# Patient Record
Sex: Female | Born: 1960 | Race: White | Hispanic: No | Marital: Married | Smoking: Never smoker
Health system: Southern US, Community
[De-identification: ages and names within clinical notes are randomized; demographics above are authoritative.]

---

## 2017-04-27 ENCOUNTER — Encounter: Payer: Self-pay | Admitting: Emergency Medicine

## 2017-04-27 ENCOUNTER — Emergency Department
Admission: EM | Admit: 2017-04-27 | Discharge: 2017-04-27 | Disposition: A | Discharge status: Home / Self Care | Payer: BLUE CROSS/BLUE SHIELD | Source: Home / Self Care | Attending: Family Medicine | Admitting: Family Medicine

## 2017-04-27 ENCOUNTER — Emergency Department (INDEPENDENT_AMBULATORY_CARE_PROVIDER_SITE_OTHER): Payer: BLUE CROSS/BLUE SHIELD

## 2017-04-27 ENCOUNTER — Other Ambulatory Visit: Payer: Self-pay

## 2017-04-27 DIAGNOSIS — B9789 Other viral agents as the cause of diseases classified elsewhere: Secondary | ICD-10-CM

## 2017-04-27 DIAGNOSIS — M67921 Unspecified disorder of synovium and tendon, right upper arm: Secondary | ICD-10-CM

## 2017-04-27 DIAGNOSIS — M542 Cervicalgia: Secondary | ICD-10-CM

## 2017-04-27 DIAGNOSIS — M79601 Pain in right arm: Secondary | ICD-10-CM

## 2017-04-27 DIAGNOSIS — M7551 Bursitis of right shoulder: Secondary | ICD-10-CM

## 2017-04-27 DIAGNOSIS — G5621 Lesion of ulnar nerve, right upper limb: Secondary | ICD-10-CM

## 2017-04-27 DIAGNOSIS — M7711 Lateral epicondylitis, right elbow: Secondary | ICD-10-CM | POA: Diagnosis not present

## 2017-04-27 DIAGNOSIS — J069 Acute upper respiratory infection, unspecified: Secondary | ICD-10-CM

## 2017-04-27 MED ORDER — KETOROLAC TROMETHAMINE 30 MG/ML IJ SOLN
30.0000 mg | Freq: Once | INTRAMUSCULAR | Status: AC
  Administered 2017-04-27: 30 mg via INTRAMUSCULAR

## 2017-04-27 MED ORDER — AMOXICILLIN 875 MG PO TABS: 875.0000 mg | ORAL_TABLET | Freq: Two times a day (BID) | ORAL | 0 refills | Status: DC

## 2017-04-27 MED ORDER — PREDNISONE 20 MG PO TABS: ORAL_TABLET | ORAL | 0 refills | Status: DC

## 2017-04-27 NOTE — Discharge Instructions (Signed)
Take plain guaifenesin (1200mg  extended release tabs such as Mucinex) twice daily, with plenty of water, for cough and congestion.  May add Pseudoephedrine (30mg , one or two every 4 to 6 hours) for sinus congestion.  Get adequate rest.   May use Afrin nasal spray (or generic oxymetazoline) each morning for about 5 days and then discontinue.  Also recommend using saline nasal spray several times daily and saline nasal irrigation (AYR is a common brand).  Use Flonase nasal spray each morning after using Afrin nasal spray and saline nasal irrigation. Try warm salt water gargles for sore throat.  Stop all antihistamines for now, and other non-prescription cough/cold preparations. May take Delsym Cough Suppressant at bedtime for nighttime cough.   Begin range of motion and stretching exercises as per instruction sheets.

## 2017-04-27 NOTE — ED Provider Notes (Signed)
[facility]    CSN: [id number] Arrival date & time: 04/27/17  [id number]     History   Chief Complaint Chief Complaint  Patient presents with  . Elbow Pain  . Sinus Problem    HPI Mikayla Hull is a 57 y.o. female.   Patient presents with several problems: 1)  She has had a 4 day history of typical cold-like symptoms developing over several days, including mild sore throat, sinus congestion (worse on the right), headache, fatigue, and cough.   No fevers, chills, and sweats.  She has a history of sinusitis. 2)  About 7 or 8 months ago she developed pain in her right elbow that occasionally radiates to her right shoulder.  She has a past history of right carpal tunnel surgery.  During the past 6 months she has had intermittent tingling in her right 4th and 5th fingers. 3)  Recently she has developed increasing pain in her right posterior neck radiating into her right shoulder.  The pain is worse when she flexes her neck, and rotates her head to the left.    The history is provided by the patient.    History reviewed. No pertinent past medical history.  There are no active problems to display for this patient.   History reviewed. No pertinent surgical history.  OB History    No data available       Home Medications    Prior to Admission medications   Medication Sig Start Date End Date Taking? Authorizing Provider  venlafaxine (EFFEXOR) 75 MG tablet Take 75 mg by mouth 2 (two) times daily.   Yes [provider]  amoxicillin (AMOXIL) 875 MG tablet Take 1 tablet (875 mg total) by mouth 2 (two) times daily. 04/27/17   Lattie HawBeese, Welton Bord A, MD  predniSONE (DELTASONE) 20 MG tablet Take one tab by mouth twice daily for 5 days, then one daily for 4 days. Take with food. 04/27/17   Lattie HawBeese, Donnamaria Shands A, MD    Family History No family history on file.  Social History Social History   Tobacco Use  . Smoking status: Never Smoker  . Smokeless tobacco: Never Used    Substance Use Topics  . Alcohol use: No    Frequency: Never  . Drug use: No     Allergies   Patient has no known allergies.   Review of Systems Review of Systems  Constitutional: Positive for activity change and fatigue. Negative for chills, diaphoresis and fever.  HENT: Positive for congestion, rhinorrhea, sinus pressure, sinus pain and sore throat. Negative for ear pain, facial swelling, hearing loss, sneezing and trouble swallowing.   Respiratory: Positive for cough. Negative for shortness of breath, wheezing and stridor.   Cardiovascular: Negative.   Gastrointestinal: Negative.   Genitourinary: Negative.   Musculoskeletal: Positive for neck pain. Negative for joint swelling.  Skin: Negative.   Neurological: Positive for numbness.      Physical Exam Triage Vital Signs ED Triage Vitals  Enc Vitals Group     BP 04/27/17 0923 136/72     Pulse Rate 04/27/17 0923 75     Resp --      Temp 04/27/17 0923 98.2 F (36.8 C)     Temp Source 04/27/17 0923 Oral     SpO2 04/27/17 0923 97 %     Weight 04/27/17 0924 201 lb (91.2 kg)     Height 04/27/17 0924 5\' 8"  (1.727 m)     Head Circumference --  Peak Flow --      Pain Score 04/27/17 0924 8     Pain Loc --      Pain Edu? --      Excl. in GC? --    No data found.  Updated Vital Signs BP 136/72 (BP Location: Right Arm)   Pulse 75   Temp 98.2 F (36.8 C) (Oral)   Ht 5\' 8"  (1.727 m)   Wt 201 lb (91.2 kg)   SpO2 97%   BMI 30.56 kg/m   Visual Acuity Right Eye Distance:   Left Eye Distance:   Bilateral Distance:    Right Eye Near:   Left Eye Near:    Bilateral Near:     Physical Exam  Constitutional: She appears well-developed and well-nourished. No distress.  HENT:  Head: Normocephalic.  Right Ear: Tympanic membrane, external ear and ear canal normal.  Left Ear: Tympanic membrane, external ear and ear canal normal.  Nose: Nose normal.  Mouth/Throat: Oropharynx is clear and moist.  Eyes: Conjunctivae are  normal. Pupils are equal, round, and reactive to light. Right eye exhibits no discharge. Left eye exhibits no discharge.  Neck:    Lateral nodes tender to palpation, worse on the left.  There is mild tenderness of the right occipital area and right trapezius muscle extending to the right shoulder.  Cardiovascular: Normal heart sounds.  Pulmonary/Chest: Breath sounds normal.  Abdominal: There is no tenderness.  Musculoskeletal:       Right shoulder: She exhibits tenderness and pain. She exhibits normal range of motion and no crepitus.       Right elbow: She exhibits normal range of motion. Tenderness found. Medial epicondyle tenderness noted.       Arms: RIght shoulder has relatively good range of motion.  Negative Apley's test.  There is distinct tenderness to palpation over the right subacromial bursa, and distinct tenderness to palpation over the long head of the biceps tendon.  Right elbow has distinct tenderness to palpation over the medial epicondyle.  Palpation causes distinct paresthesias in her right 4th and 5th fingers.  Neurological: She is alert.  Skin: Skin is warm and dry.  Nursing note and vitals reviewed.     UC Treatments / Results  Labs (all labs ordered are listed, but only abnormal results are displayed) Labs Reviewed - No data to display  EKG  EKG Interpretation None       Radiology Dg Cervical Spine Complete  Result Date: 04/27/2017 CLINICAL DATA:  Right-sided neck pain and right arm pain. EXAM: CERVICAL SPINE - COMPLETE 4+ VIEW COMPARISON:  None. FINDINGS: There is no evidence of cervical spine fracture or prevertebral soft tissue swelling. Alignment is normal. No other significant bone abnormalities are identified. IMPRESSION: Negative cervical spine radiographs. Electronically Signed   By: Francene Boyers M.D.   On: 04/27/2017 10:08    Procedures Procedures (including critical care time)  Medications Ordered in UC Medications  ketorolac (TORADOL) 30  MG/ML injection 30 mg (30 mg Intramuscular Given 04/27/17 1052)     Initial Impression / Assessment and Plan / UC Course  I have reviewed the triage vital signs and the nursing notes.  Pertinent labs & imaging results that were available during my care of the patient were reviewed by me and considered in my medical decision making (see chart for details).    Administered Toradol 30mg  IM. Begin prednisone burst/taper. Take plain guaifenesin (1200mg  extended release tabs such as Mucinex) twice daily, with plenty of water, for  cough and congestion.  May add Pseudoephedrine (30mg , one or two every 4 to 6 hours) for sinus congestion.  Get adequate rest.   May use Afrin nasal spray (or generic oxymetazoline) each morning for about 5 days and then discontinue.  Also recommend using saline nasal spray several times daily and saline nasal irrigation (AYR is a common brand).  Use Flonase nasal spray each morning after using Afrin nasal spray and saline nasal irrigation. Try warm salt water gargles for sore throat.  Stop all antihistamines for now, and other non-prescription cough/cold preparations. May take Delsym Cough Suppressant at bedtime for nighttime cough.   Begin range of motion and stretching exercises as per instruction sheets. Followup with Dr. [facility] ([facility] for further evaluation of right shoulder and elbow pain.    Final Clinical Impressions(s) / UC Diagnoses   Final diagnoses:  Right lateral epicondylitis  Acute bursitis of right shoulder  Tendinopathy of right biceps tendon  Ulnar neuropathy at elbow of right upper extremity  Viral URI with cough    ED Discharge Orders        Ordered    predniSONE (DELTASONE) 20 MG tablet     04/27/17 1050    amoxicillin (AMOXIL) 875 MG tablet  2 times daily     04/27/17 1050          Lattie Haw, MD 05/02/17 2101

## 2017-04-27 NOTE — ED Triage Notes (Signed)
Rt elbow pain radiates up into neck x 8 months Cough and congestion x 4 days

## 2017-06-16 ENCOUNTER — Other Ambulatory Visit: Payer: Self-pay

## 2017-06-16 ENCOUNTER — Encounter: Payer: Self-pay | Admitting: Emergency Medicine

## 2017-06-16 ENCOUNTER — Emergency Department
Admission: EM | Admit: 2017-06-16 | Discharge: 2017-06-16 | Disposition: A | Discharge status: Home / Self Care | Payer: BLUE CROSS/BLUE SHIELD | Source: Home / Self Care | Attending: Emergency Medicine | Admitting: Emergency Medicine

## 2017-06-16 DIAGNOSIS — R05 Cough: Secondary | ICD-10-CM | POA: Diagnosis not present

## 2017-06-16 DIAGNOSIS — R059 Cough, unspecified: Secondary | ICD-10-CM

## 2017-06-16 DIAGNOSIS — J40 Bronchitis, not specified as acute or chronic: Secondary | ICD-10-CM

## 2017-06-16 MED ORDER — PREDNISONE 10 MG PO TABS: ORAL_TABLET | ORAL | 0 refills | Status: DC

## 2017-06-16 MED ORDER — ACETAMINOPHEN 325 MG PO TABS
975.0000 mg | ORAL_TABLET | Freq: Once | ORAL | Status: AC
  Administered 2017-06-16: 975 mg via ORAL

## 2017-06-16 MED ORDER — PROMETHAZINE-CODEINE 6.25-10 MG/5ML PO SYRP: ORAL_SOLUTION | ORAL | 0 refills | Status: AC

## 2017-06-16 MED ORDER — AZITHROMYCIN 250 MG PO TABS: ORAL_TABLET | ORAL | 0 refills | Status: DC

## 2017-06-16 MED ORDER — METHYLPREDNISOLONE ACETATE 80 MG/ML IJ SUSP
80.0000 mg | Freq: Once | INTRAMUSCULAR | Status: AC
  Administered 2017-06-16: 80 mg via INTRAMUSCULAR

## 2017-06-16 NOTE — ED Triage Notes (Signed)
Reports cough, sore throat, headache and chest soreness from cough for past 4 days. Called PCP yesterday and got rx for tessalon; discovered no longer in network for new insurance so has come here. No OTC today.

## 2017-06-16 NOTE — Discharge Instructions (Signed)
Today, we gave you a cortisone shot of Depo-Medrol. Take prescriptions as prescribed. Follow-up with PCP within one week.

## 2017-06-17 NOTE — ED Provider Notes (Signed)
[facility]    CSN: [id number] Arrival date & time: 06/16/17  0930     History   Chief Complaint Chief Complaint  Patient presents with  . Cough  . Headache  . Sore Throat    HPI Mikayla Hull is a 57 y.o. female.   HPI  cough, sore throat, headache and chest soreness from cough for past 6 days.  Called PCP yesterday and got rx for tessalon; discovered no longer in network for new insurance so has come here. No OTC today.   History reviewed. No pertinent past medical history.  There are no active problems to display for this patient.   History reviewed. No pertinent surgical history.  OB History    No data available       Home Medications    Prior to Admission medications   Medication Sig Start Date End Date Taking? Authorizing Provider  azithromycin (ZITHROMAX Z-PAK) 250 MG tablet Take 2 tablets on day one, then 1 tablet daily on days 2 through 5 06/16/17   Lajean Manes, MD  predniSONE (DELTASONE) 10 MG tablet Take as directed for 6 days. Start taking on Thursday 2/28. Take 6 on day 1, 5 on day 2, 4 on day 3, then 3 tablets on day 4, then 2 tablets on day 5, then 1 on day 6. 06/16/17   Lajean Manes, MD  promethazine-codeine [facility] WITH CODEINE) 6.25-10 MG/5ML syrup Take 1-2 teaspoons every 4-6 hours as needed for cough. May cause drowsiness. 06/16/17   Lajean Manes, MD    Family History History reviewed. No pertinent family history.  Social History Social History   Tobacco Use  . Smoking status: Never Smoker  . Smokeless tobacco: Never Used  Substance Use Topics  . Alcohol use: No    Frequency: Never  . Drug use: No     Allergies   Patient has no known allergies.   Review of Systems Review of Systems  Constitutional: Positive for fatigue and fever.  HENT: Positive for congestion and sore throat.   Respiratory: Positive for cough.   Cardiovascular: Negative for palpitations and leg swelling.  Gastrointestinal: Negative.     All other systems reviewed and are negative.    Physical Exam Triage Vital Signs ED Triage Vitals  Enc Vitals Group     BP 06/16/17 1003 127/70     Pulse Rate 06/16/17 1003 97     Resp 06/16/17 1003 18     Temp 06/16/17 1003 98.4 F (36.9 C)     Temp Source 06/16/17 1003 Oral     SpO2 06/16/17 1003 97 %     Weight 06/16/17 1004 197 lb (89.4 kg)     Height 06/16/17 1004 5\' 8"  (1.727 m)     Head Circumference --      Peak Flow --      Pain Score 06/16/17 1003 2     Pain Loc --      Pain Edu? --      Excl. in GC? --    No data found.  Updated Vital Signs BP 127/70 (BP Location: Right Arm)   Pulse 97   Temp 98.4 F (36.9 C) (Oral)   Resp 18   Ht 5\' 8"  (1.727 m)   Wt 197 lb (89.4 kg)   SpO2 97%   BMI 29.95 kg/m   Visual Acuity Right Eye Distance:   Left Eye Distance:   Bilateral Distance:    Right Eye Near:   Left Eye  Near:    Bilateral Near:     Physical Exam  Constitutional: She is oriented to person, place, and time. She appears well-developed and well-nourished. No distress.  HENT:  Head: Normocephalic and atraumatic.  Right Ear: Tympanic membrane normal.  Left Ear: Tympanic membrane normal.  Nose: Nose normal.  Mouth/Throat: Oropharynx is clear and moist. No oropharyngeal exudate.  Eyes: Right eye exhibits no discharge. Left eye exhibits no discharge. No scleral icterus.  Neck: Neck supple.  Cardiovascular: Normal rate, regular rhythm and normal heart sounds.  Pulmonary/Chest: No accessory muscle usage or stridor. No respiratory distress. She has rhonchi. She has no rales.  Hacking cough noted.  Breath sounds equal bilaterally.  Diffuse rhonchi present.  Rare late expiratory wheezes heard anteriorly on forced expiration only.  No rales.  Lymphadenopathy:    She has no cervical adenopathy.  Neurological: She is alert and oriented to person, place, and time.  Skin: Skin is warm and dry.  Nursing note and vitals reviewed.    UC Treatments / Results   Labs (all labs ordered are listed, but only abnormal results are displayed) Labs Reviewed - No data to display  EKG  EKG Interpretation None       Radiology No results found.  Procedures Procedures (including critical care time)  Medications Ordered in UC Medications  acetaminophen (TYLENOL) tablet 975 mg (975 mg Oral Given 06/16/17 1009)  methylPREDNISolone acetate (DEPO-MEDROL) injection 80 mg (80 mg Intramuscular Given 06/16/17 1046)     Initial Impression / Assessment and Plan / UC Course  I have reviewed the triage vital signs and the nursing notes.  Pertinent labs & imaging results that were available during my care of the patient were reviewed by me and considered in my medical decision making (see chart for details).     Final Clinical Impressions(s) / UC Diagnoses   Final diagnoses:  Cough  Bronchitis   Treatment options discussed, as well as risks, benefits, alternatives. Patient voiced understanding and agreement with the following plans: Depo-Medrol 80 mg IM stat ED Discharge Orders        Ordered    azithromycin (ZITHROMAX Z-PAK) 250 MG tablet     06/16/17 1045    predniSONE (DELTASONE) 10 MG tablet     06/16/17 1045    promethazine-codeine (PHENERGAN WITH CODEINE) 6.25-10 MG/5ML syrup     06/16/17 1045     Follow-up with your primary care doctor in 5-7 days if not improving, or sooner if symptoms become worse. Precautions discussed. Red flags discussed. Questions invited and answered. Patient voiced understanding and agreement.     Lajean ManesMassey, David, MD 06/17/17 [phone]

## 2017-12-01 ENCOUNTER — Emergency Department (INDEPENDENT_AMBULATORY_CARE_PROVIDER_SITE_OTHER)
Admission: EM | Admit: 2017-12-01 | Discharge: 2017-12-01 | Disposition: A | Discharge status: Home / Self Care | Payer: BLUE CROSS/BLUE SHIELD | Source: Home / Self Care | Attending: Family Medicine | Admitting: Family Medicine

## 2017-12-01 ENCOUNTER — Other Ambulatory Visit: Payer: Self-pay

## 2017-12-01 ENCOUNTER — Encounter: Payer: Self-pay | Admitting: Emergency Medicine

## 2017-12-01 DIAGNOSIS — J320 Chronic maxillary sinusitis: Secondary | ICD-10-CM

## 2017-12-01 DIAGNOSIS — H60501 Unspecified acute noninfective otitis externa, right ear: Secondary | ICD-10-CM | POA: Diagnosis not present

## 2017-12-01 MED ORDER — AMOXICILLIN 500 MG PO CAPS: 500.0000 mg | ORAL_CAPSULE | Freq: Three times a day (TID) | ORAL | 0 refills | Status: DC

## 2017-12-01 MED ORDER — NEOMYCIN-POLYMYXIN-HC 3.5-10000-1 OT SUSP: 3.0000 [drp] | Freq: Three times a day (TID) | OTIC | 0 refills | Status: AC

## 2017-12-01 NOTE — ED Provider Notes (Signed)
[facility]    CSN: [id number] Arrival date & time: 12/01/17  1742     History   Chief Complaint Chief Complaint  Patient presents with  . Otalgia  . Neck Pain    HPI Mikayla Hull is a 57 y.o. female.   HPI Mikayla Hull is a 57 y.o. female presenting to UC with c/o 3 days of worsening Right ear pain that is aching and sore, radiating down Right side of her neck. Ear is painful to touch she could not lay on her Right side last night. Ear is also mildly itchy. No recent swimming and she cannot recall any water getting into her ear. She has tried OTC pain relief ear drops with minimal relief. Minimal Right sided nasal congestion and pressure started 2 days ago. Denies cough, congestion, n/v/d.    History reviewed. No pertinent past medical history.  There are no active problems to display for this patient.   History reviewed. No pertinent surgical history.  OB History   None      Home Medications    Prior to Admission medications   Medication Sig Start Date End Date Taking? Authorizing Provider  amoxicillin (AMOXIL) 500 MG capsule Take 1 capsule (500 mg total) by mouth 3 (three) times daily. 12/01/17   Lurene ShadowPhelps, Rida Loudin O, PA-C  azithromycin (ZITHROMAX Z-PAK) 250 MG tablet Take 2 tablets on day one, then 1 tablet daily on days 2 through 5 06/16/17   Lajean ManesMassey, David, MD  neomycin-polymyxin-hydrocortisone (CORTISPORIN) 3.5-10000-1 OTIC suspension Place 3 drops into the right ear 3 (three) times daily for 7 days. 12/01/17 12/08/17  Lurene ShadowPhelps, Aldrich Lloyd O, PA-C  predniSONE (DELTASONE) 10 MG tablet Take as directed for 6 days. Start taking on Thursday 2/28. Take 6 on day 1, 5 on day 2, 4 on day 3, then 3 tablets on day 4, then 2 tablets on day 5, then 1 on day 6. 06/16/17   Lajean ManesMassey, David, MD  promethazine-codeine [facility](PHENERGAN WITH CODEINE) 6.25-10 MG/5ML syrup Take 1-2 teaspoons every 4-6 hours as needed for cough. May cause drowsiness. 06/16/17   Lajean ManesMassey, David, MD    Family  History No family history on file.  Social History Social History   Tobacco Use  . Smoking status: Never Smoker  . Smokeless tobacco: Never Used  Substance Use Topics  . Alcohol use: No    Frequency: Never  . Drug use: No     Allergies   Patient has no known allergies.   Review of Systems Review of Systems  Constitutional: Negative for chills and fever.  HENT: Positive for congestion, ear pain (Right), postnasal drip, sinus pressure and sinus pain. Negative for sore throat, trouble swallowing and voice change.   Respiratory: Negative for cough and shortness of breath.   Cardiovascular: Negative for chest pain and palpitations.  Gastrointestinal: Negative for abdominal pain, diarrhea, nausea and vomiting.  Musculoskeletal: Negative for arthralgias, back pain and myalgias.  Skin: Negative for rash.  Neurological: Negative for dizziness, light-headedness and headaches.     Physical Exam Triage Vital Signs ED Triage Vitals  Enc Vitals Group     BP 12/01/17 1812 112/69     Pulse Rate 12/01/17 1812 80     Resp 12/01/17 1812 16     Temp 12/01/17 1812 98.1 F (36.7 C)     Temp Source 12/01/17 1812 Oral     SpO2 12/01/17 1812 98 %     Weight 12/01/17 1813 180 lb (81.6 kg)  Height 12/01/17 1813 5\' 8"  (1.727 m)     Head Circumference --      Peak Flow --      Pain Score 12/01/17 1813 7     Pain Loc --      Pain Edu? --      Excl. in GC? --    No data found.  Updated Vital Signs BP 112/69 (BP Location: Right Arm)   Pulse 80   Temp 98.1 F (36.7 C) (Oral)   Resp 16   Ht 5\' 8"  (1.727 m)   Wt 180 lb (81.6 kg)   SpO2 98%   BMI 27.37 kg/m   Visual Acuity Right Eye Distance:   Left Eye Distance:   Bilateral Distance:    Right Eye Near:   Left Eye Near:    Bilateral Near:     Physical Exam  Constitutional: She is oriented to person, place, and time. She appears well-developed and well-nourished. No distress.  HENT:  Head: Normocephalic and atraumatic.    Right Ear: There is swelling and tenderness. No drainage. Tympanic membrane is erythematous. Tympanic membrane is not bulging. No middle ear effusion.  Left Ear: Tympanic membrane normal.  Nose: Right sinus exhibits maxillary sinus tenderness. Right sinus exhibits no frontal sinus tenderness. Left sinus exhibits no maxillary sinus tenderness and no frontal sinus tenderness.  Mouth/Throat: Uvula is midline, oropharynx is clear and moist and mucous membranes are normal.  Eyes: EOM are normal.  Neck: Normal range of motion. Neck supple.  Cardiovascular: Normal rate and regular rhythm.  Pulmonary/Chest: Effort normal and breath sounds normal. No stridor. No respiratory distress. She has no wheezes. She has no rales.  Musculoskeletal: Normal range of motion.  Lymphadenopathy:    She has no cervical adenopathy.  Neurological: She is alert and oriented to person, place, and time.  Skin: Skin is warm and dry. She is not diaphoretic.  Psychiatric: She has a normal mood and affect. Her behavior is normal.  Nursing note and vitals reviewed.    UC Treatments / Results  Labs (all labs ordered are listed, but only abnormal results are displayed) Labs Reviewed - No data to display  EKG None  Radiology No results found.  Procedures Procedures (including critical care time)  Medications Ordered in UC Medications - No data to display  Initial Impression / Assessment and Plan / UC Course  I have reviewed the triage vital signs and the nursing notes.  Pertinent labs & imaging results that were available during my care of the patient were reviewed by me and considered in my medical decision making (see chart for details).     Hx and exam c/w Right acute otitis externa, also question early sinusitis given Right maxillary tenderness and reported congestion.  Final Clinical Impressions(s) / UC Diagnoses   Final diagnoses:  Acute otitis externa of right ear, unspecified type  Right maxillary  sinusitis     Discharge Instructions      You may take acetaminophen and ibuprofen as needed for pain.   Please use the ear drops and take the oral antibiotic as prescribed and be sure to complete entire course even if you start to feel better to ensure infection does not come back.  Please follow up with family medicine in 1 week if not improving, sooner if significantly worsening.    ED Prescriptions    Medication Sig Dispense Auth. Provider   amoxicillin (AMOXIL) 500 MG capsule Take 1 capsule (500 mg total) by mouth 3 (  three) times daily. 21 capsule Doroteo Glassman, Leonardo Makris O, PA-C   neomycin-polymyxin-hydrocortisone (CORTISPORIN) 3.5-10000-1 OTIC suspension Place 3 drops into the right ear 3 (three) times daily for 7 days. 10 mL Lurene Shadow, PA-C     Controlled Substance Prescriptions [place] Controlled Substance Registry consulted? Not Applicable   Rolla Plate 12/01/17 1858

## 2017-12-01 NOTE — Discharge Instructions (Signed)
°  You may take acetaminophen and ibuprofen as needed for pain.   Please use the ear drops and take the oral antibiotic as prescribed and be sure to complete entire course even if you start to feel better to ensure infection does not come back.  Please follow up with family medicine in 1 week if not improving, sooner if significantly worsening.

## 2017-12-01 NOTE — ED Triage Notes (Signed)
Patient reports pain in right ear and along lymphnodes in neck for past 3 days; last night too painful to sleep on right side; using OTC drops for ear pain without relief; refusing our OTCs.

## 2017-12-03 ENCOUNTER — Telehealth: Payer: Self-pay

## 2017-12-03 NOTE — Telephone Encounter (Signed)
Left VM with contact information if any questions or concerns.   

## 2019-01-02 IMAGING — DX DG CERVICAL SPINE COMPLETE 4+V
6 series · 6 of 6 positions shown · non-contrast
Comparison: None.

CLINICAL DATA: Right-sided neck pain and right arm pain.

EXAM:
CERVICAL SPINE - COMPLETE 4+ VIEW

[c-spine lat]
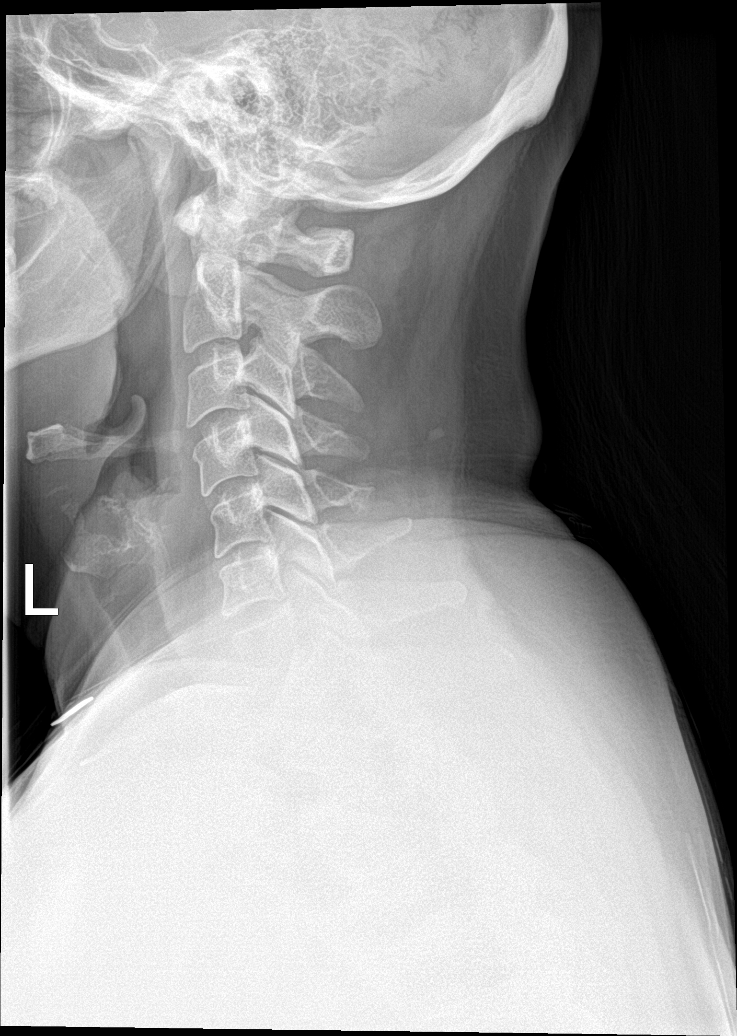

[c-spine obl (1 of 2)]
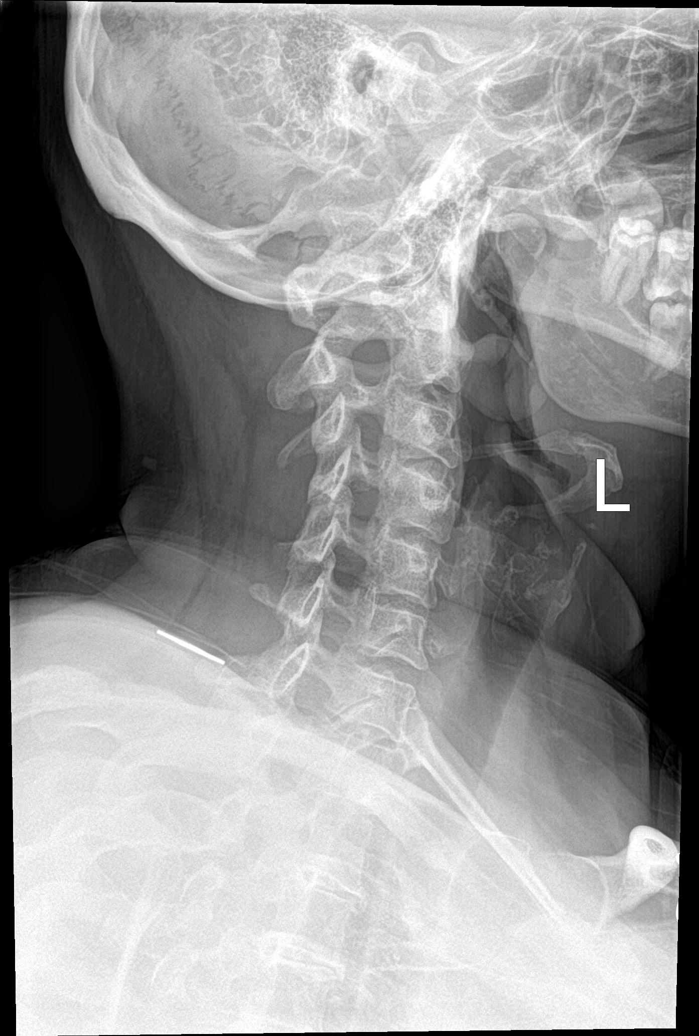

[c-spine obl (2 of 2)]
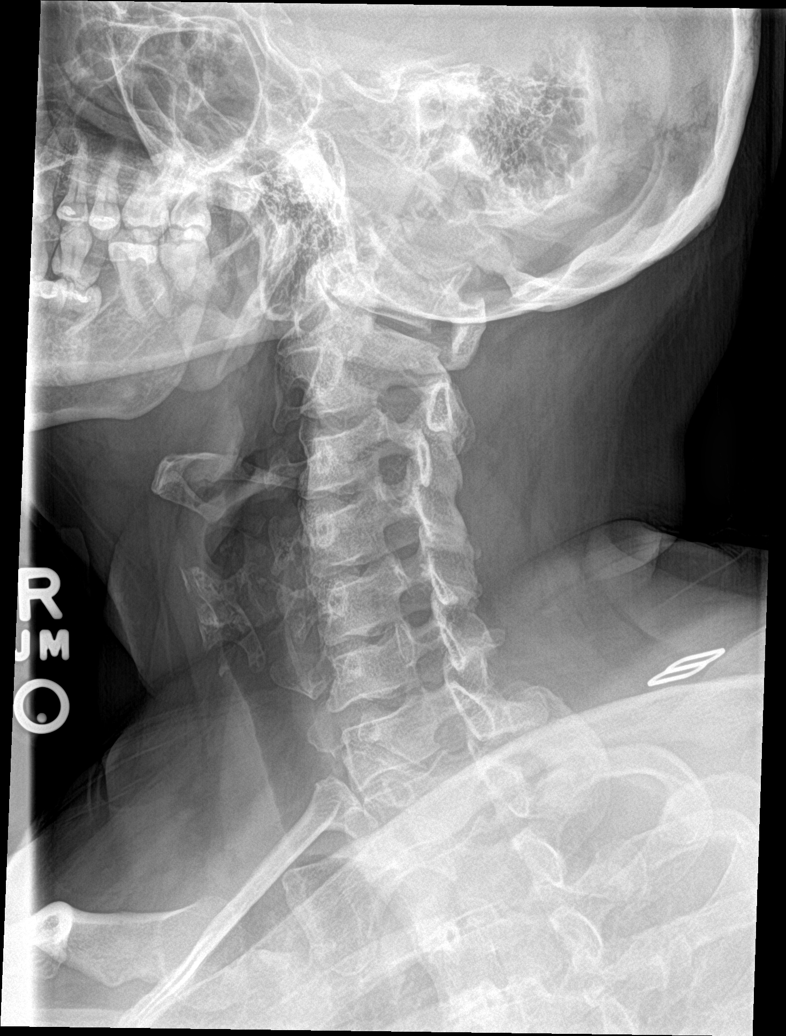

[c-spine ap]
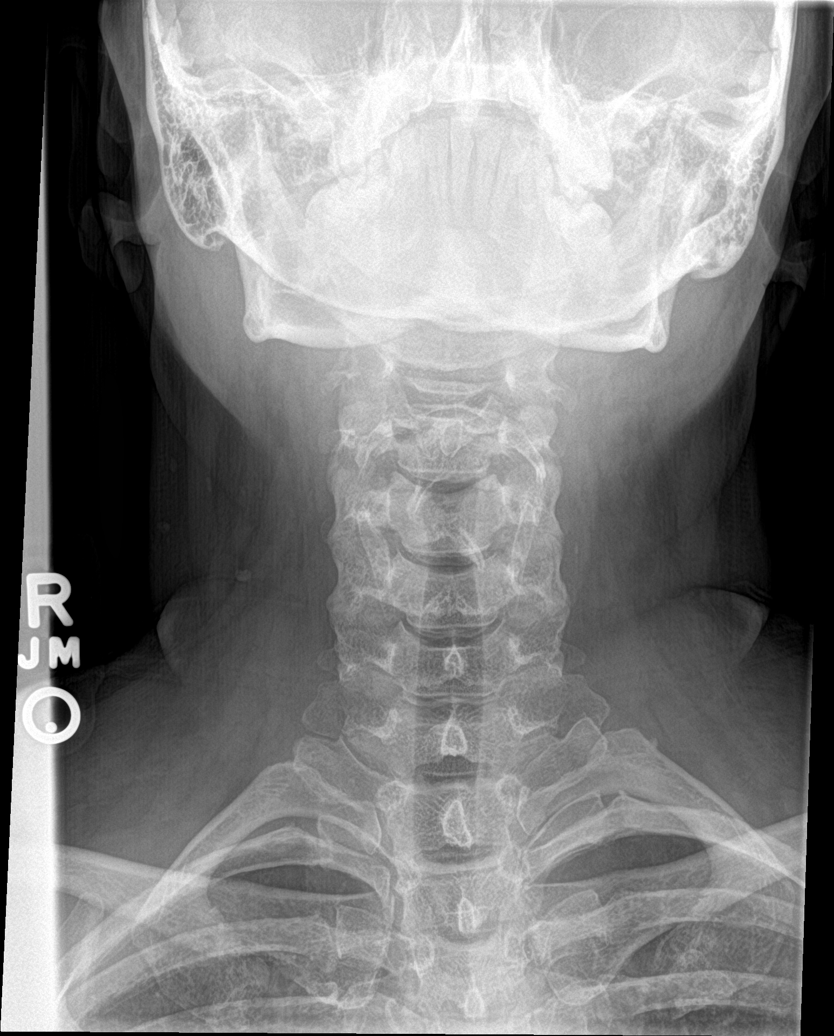

[c-spine open mouth]
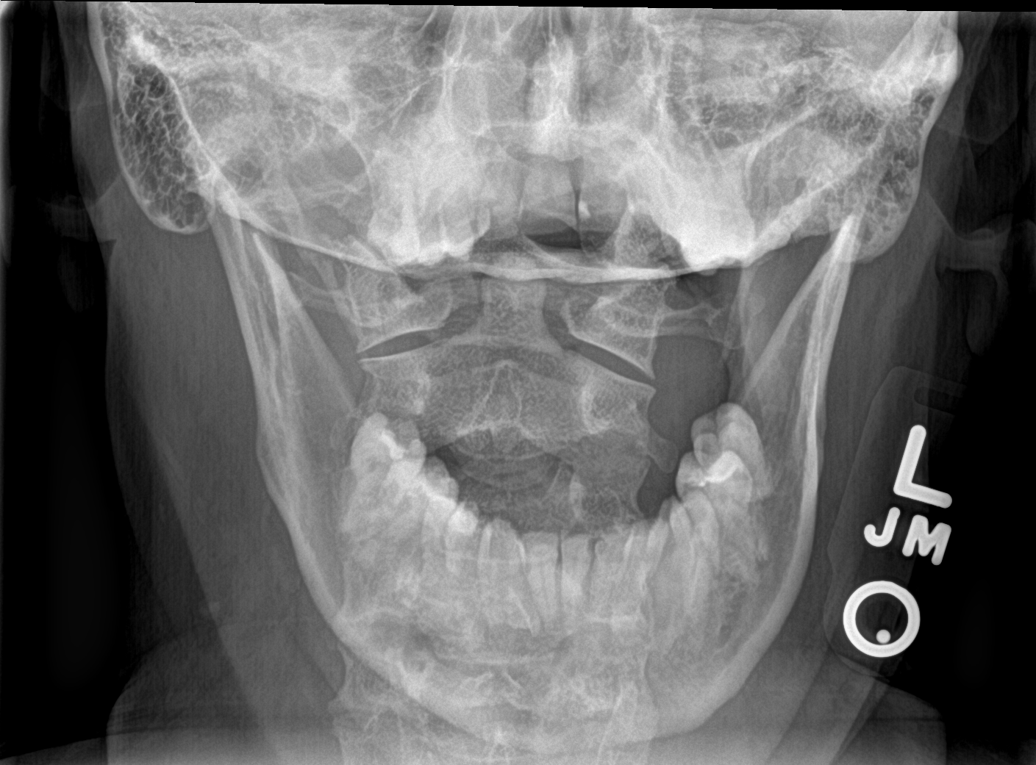

[c-spine swimmers]
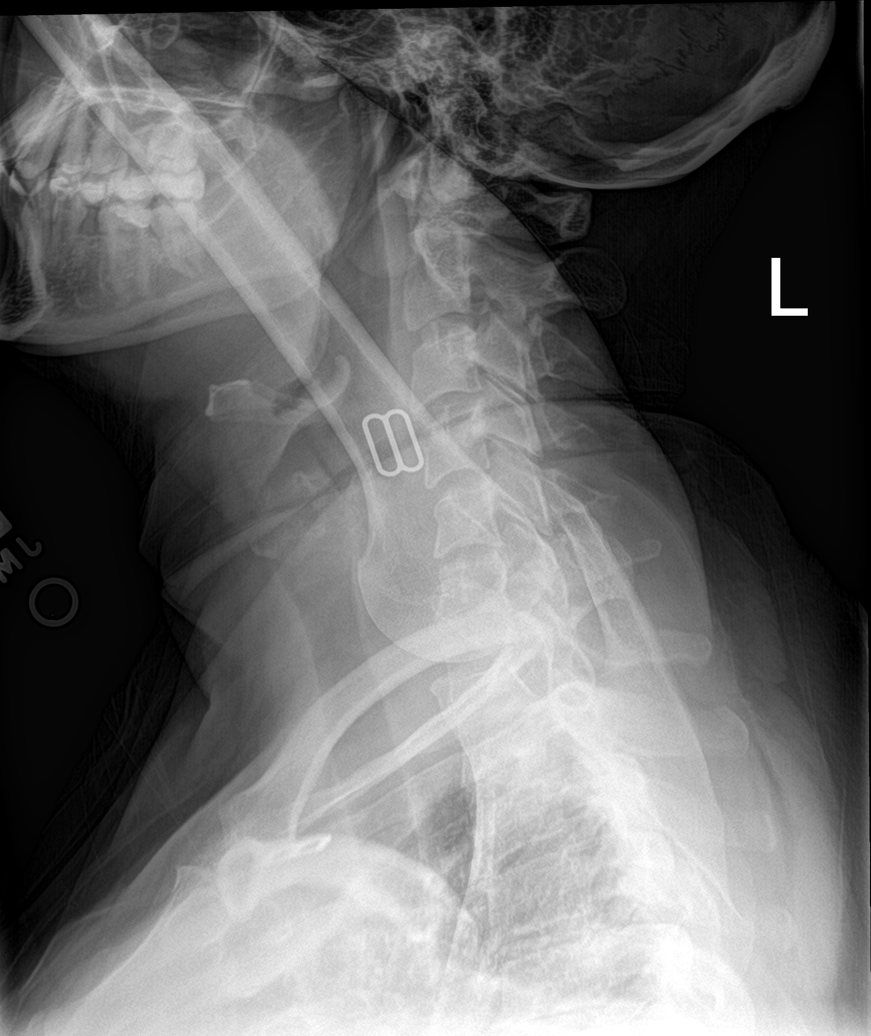

[6 of 6 positions shown; findings below may reference images not displayed]

FINDINGS: There is no evidence of cervical spine fracture or prevertebral soft
tissue swelling. Alignment is normal. No other significant bone
abnormalities are identified.
IMPRESSION: Negative cervical spine radiographs.

## 2019-10-21 ENCOUNTER — Emergency Department
Admission: EM | Admit: 2019-10-21 | Discharge: 2019-10-21 | Disposition: A | Discharge status: Home / Self Care | Payer: BC Managed Care – PPO | Source: Home / Self Care | Attending: Family Medicine | Admitting: Family Medicine

## 2019-10-21 ENCOUNTER — Other Ambulatory Visit: Payer: Self-pay

## 2019-10-21 DIAGNOSIS — J069 Acute upper respiratory infection, unspecified: Secondary | ICD-10-CM

## 2019-10-21 MED ORDER — GUAIFENESIN-CODEINE 100-10 MG/5ML PO SOLN: ORAL | 0 refills | Status: DC

## 2019-10-21 NOTE — ED Provider Notes (Signed)
[name]    CSN: [id number] Arrival date & time: 10/21/19  2094      History   Chief Complaint Chief Complaint  Patient presents with  . Sinus issues    HPI Mikayla Hull is a 59 y.o. female.   Patient complains of 6 day history of typical cold-like symptoms developing over several days, including mild sore throat, sinus congestion, fatigue, and cough.  Her cough started 3 days ago, and wakes her up at night.  She denies pleuritic pain and shortness of breath.  Her granddaughter had similar symptoms several days before patient developed symptoms.  The history is provided by the patient.    History reviewed. No pertinent past medical history.  There are no problems to display for this patient.   History reviewed. No pertinent surgical history.  OB History   No obstetric history on file.      Home Medications    Prior to Admission medications   Medication Sig Start Date End Date Taking? Authorizing Provider  amoxicillin (AMOXIL) 500 MG capsule Take 1 capsule (500 mg total) by mouth 3 (three) times daily. 12/01/17   Lurene Shadow, PA-C  azithromycin (ZITHROMAX Z-PAK) 250 MG tablet Take 2 tablets on day one, then 1 tablet daily on days 2 through 5 06/16/17   Lajean Manes, MD  guaiFENesin-codeine 100-10 MG/5ML syrup Take 59mL by mouth at bedtime as needed for cough. 10/21/19   Lattie Haw, MD  predniSONE (DELTASONE) 10 MG tablet Take as directed for 6 days. Start taking on Thursday 2/28. Take 6 on day 1, 5 on day 2, 4 on day 3, then 3 tablets on day 4, then 2 tablets on day 5, then 1 on day 6. 06/16/17   Lajean Manes, MD  promethazine-codeine [facility] WITH CODEINE) 6.25-10 MG/5ML syrup Take 1-2 teaspoons every 4-6 hours as needed for cough. May cause drowsiness. 06/16/17   Lajean Manes, MD    Family History History reviewed. No pertinent family history.  Social History Social History   Tobacco Use  . Smoking status: Never Smoker  . Smokeless  tobacco: Never Used  Vaping Use  . Vaping Use: Never used  Substance Use Topics  . Alcohol use: No  . Drug use: No     Allergies   Patient has no known allergies.   Review of Systems Review of Systems + sore throat + cough No pleuritic pain No wheezing + nasal congestion + post-nasal drainage No sinus pain/pressure No itchy/red eyes ? earache No hemoptysis No SOB No fever, + chills No nausea No vomiting No abdominal pain No diarrhea No urinary symptoms No skin rash + fatigue No myalgias No headache Used OTC meds (Alka Seltzer, etc) without relief   Physical Exam Triage Vital Signs ED Triage Vitals  Enc Vitals Group     BP 10/21/19 0850 130/75     Pulse Rate 10/21/19 0850 99     Resp 10/21/19 0850 18     Temp 10/21/19 0850 99.4 F (37.4 C)     Temp Source 10/21/19 0850 Oral     SpO2 10/21/19 0850 97 %     Weight --      Height --      Head Circumference --      Peak Flow --      Pain Score 10/21/19 0851 0     Pain Loc --      Pain Edu? --      Excl. in GC? --  No data found.  Updated Vital Signs BP 130/75 (BP Location: Right Arm)   Pulse 99   Temp 99.4 F (37.4 C) (Oral)   Resp 18   SpO2 97%   Visual Acuity Right Eye Distance:   Left Eye Distance:   Bilateral Distance:    Right Eye Near:   Left Eye Near:    Bilateral Near:     Physical Exam Nursing notes and Vital Signs reviewed. Appearance:  Patient appears stated age, and in no acute distress Eyes:  Pupils are equal, round, and reactive to light and accomodation.  Extraocular movement is intact.  Conjunctivae are not inflamed  Ears:  Canals normal.  Tympanic membranes normal.  Nose:  Mildly congested turbinates.  No sinus tenderness.  Pharynx:  Normal Neck:  Supple.  Mildly enlarged lateral nodes are present, tender to palpation on the left.   Lungs:  Clear to auscultation.  Breath sounds are equal.  Moving air well. Chest:  Distinct tenderness to palpation over the mid-sternum.   Heart:  Regular rate and rhythm without murmurs, rubs, or gallops.  Abdomen:  Nontender without masses or hepatosplenomegaly.  Bowel sounds are present.  No CVA or flank tenderness.  Extremities:  No edema.  Skin:  No rash present.   UC Treatments / Results  Labs (all labs ordered are listed, but only abnormal results are displayed) Labs Reviewed - No data to display  EKG   Radiology No results found.  Procedures Procedures (including critical care time)  Medications Ordered in UC Medications - No data to display  Initial Impression / Assessment and Plan / UC Course  I have reviewed the triage vital signs and the nursing notes.  Pertinent labs & imaging results that were available during my care of the patient were reviewed by me and considered in my medical decision making (see chart for details).    Benign exam.  There is no evidence of bacterial infection today.  Treat symptomatically for now  Rx for Robitussin AC for night time cough.  Followup with Family Doctor if not improved in about 10 days.   Final Clinical Impressions(s) / UC Diagnoses   Final diagnoses:  Viral URI with cough     Discharge Instructions     Take plain guaifenesin (1200mg  extended release tabs such as Mucinex) twice daily, with plenty of water, for cough and congestion.  May add Pseudoephedrine (30mg , one or two every 4 to 6 hours) for sinus congestion.  Get adequate rest.   May use Afrin nasal spray (or generic oxymetazoline) each morning for about 5 days and then discontinue.  Also recommend using saline nasal spray several times daily and saline nasal irrigation (AYR is a common brand).  Use Flonase nasal spray each morning after using Afrin nasal spray and saline nasal irrigation. Try warm salt water gargles for sore throat.  Stop all antihistamines for now, and other non-prescription cough/cold preparations. May take Ibuprofen 200mg , 4 tabs every 8 hours with food for chest/sternum  discomfort.      ED Prescriptions    Medication Sig Dispense Auth. Provider   guaiFENesin-codeine 100-10 MG/5ML syrup Take 5mL by mouth at bedtime as needed for cough. 50 mL , MD        , MD 10/21/19 [phone]

## 2019-10-21 NOTE — Discharge Instructions (Signed)
Take plain guaifenesin (1200mg extended release tabs such as Mucinex) twice daily, with plenty of water, for cough and congestion.  May add Pseudoephedrine (30mg, one or two every 4 to 6 hours) for sinus congestion.  Get adequate rest.   °May use Afrin nasal spray (or generic oxymetazoline) each morning for about 5 days and then discontinue.  Also recommend using saline nasal spray several times daily and saline nasal irrigation (AYR is a common brand).  Use Flonase nasal spray each morning after using Afrin nasal spray and saline nasal irrigation. °Try warm salt water gargles for sore throat.  °Stop all antihistamines for now, and other non-prescription cough/cold preparations. °May take Ibuprofen 200mg, 4 tabs every 8 hours with food for chest/sternum discomfort. °  °

## 2019-10-21 NOTE — ED Triage Notes (Signed)
Pt c/o cold sxs x 1 week. Granddaughter sick with a sinus infection which she was around. Facial pain, cough. Taking Mucinex, Alkaseltzer, and Delsym prn.

## 2023-07-14 ENCOUNTER — Emergency Department (HOSPITAL_COMMUNITY)

## 2023-07-14 ENCOUNTER — Other Ambulatory Visit: Payer: Self-pay

## 2023-07-14 ENCOUNTER — Emergency Department (HOSPITAL_COMMUNITY)
Admission: EM | Admit: 2023-07-14 | Discharge: 2023-07-14 | Disposition: A | Discharge status: Home / Self Care | Attending: Emergency Medicine | Admitting: Emergency Medicine

## 2023-07-14 ENCOUNTER — Encounter (HOSPITAL_COMMUNITY): Payer: Self-pay

## 2023-07-14 DIAGNOSIS — R569 Unspecified convulsions: Secondary | ICD-10-CM | POA: Diagnosis not present

## 2023-07-14 DIAGNOSIS — E876 Hypokalemia: Secondary | ICD-10-CM | POA: Insufficient documentation

## 2023-07-14 DIAGNOSIS — R718 Other abnormality of red blood cells: Secondary | ICD-10-CM | POA: Insufficient documentation

## 2023-07-14 DIAGNOSIS — S0003XA Contusion of scalp, initial encounter: Secondary | ICD-10-CM | POA: Insufficient documentation

## 2023-07-14 DIAGNOSIS — M542 Cervicalgia: Secondary | ICD-10-CM | POA: Insufficient documentation

## 2023-07-14 DIAGNOSIS — S0990XA Unspecified injury of head, initial encounter: Secondary | ICD-10-CM | POA: Diagnosis present

## 2023-07-14 DIAGNOSIS — W19XXXA Unspecified fall, initial encounter: Secondary | ICD-10-CM | POA: Insufficient documentation

## 2023-07-14 LAB — COMPREHENSIVE METABOLIC PANEL
ALT: 24 U/L (ref 0–44)
AST: 28 U/L (ref 15–41)
Albumin: 4 g/dL (ref 3.5–5.0)
Alkaline Phosphatase: 68 U/L (ref 38–126)
Anion gap: 16 — ABNORMAL HIGH (ref 5–15)
BUN: 12 mg/dL (ref 8–23)
CO2: 17 mmol/L — ABNORMAL LOW (ref 22–32)
Calcium: 9.2 mg/dL (ref 8.9–10.3)
Chloride: 107 mmol/L (ref 98–111)
Creatinine, Ser: 0.97 mg/dL (ref 0.44–1.00)
GFR, Estimated: >60 mL/min (ref >60)
Glucose, Bld: 107 mg/dL — ABNORMAL HIGH (ref 70–99)
Potassium: 3.2 mmol/L — ABNORMAL LOW (ref 3.5–5.1)
Sodium: 140 mmol/L (ref 135–145)
Total Bilirubin: 0.8 mg/dL (ref 0.0–1.2)
Total Protein: 6.8 g/dL (ref 6.5–8.1)

## 2023-07-14 LAB — I-STAT CG4 LACTIC ACID, ED
Lactic Acid, Venous: 5.3 mmol/L (ref 0.5–1.9)
Lactic Acid, Venous: 1.2 mmol/L (ref 0.5–1.9)

## 2023-07-14 LAB — I-STAT CHEM 8, ED
BUN: 11 mg/dL (ref 8–23)
Calcium, Ion: 1.14 mmol/L — ABNORMAL LOW (ref 1.15–1.40)
Chloride: 108 mmol/L (ref 98–111)
Creatinine, Ser: 0.9 mg/dL (ref 0.44–1.00)
Glucose, Bld: 99 mg/dL (ref 70–99)
HCT: 45 % (ref 36.0–46.0)
Hemoglobin: 15.3 g/dL — ABNORMAL HIGH (ref 12.0–15.0)
Potassium: 3.1 mmol/L — ABNORMAL LOW (ref 3.5–5.1)
Sodium: 141 mmol/L (ref 135–145)
TCO2: 19 mmol/L — ABNORMAL LOW (ref 22–32)

## 2023-07-14 LAB — CBC
HCT: 46 % (ref 36.0–46.0)
Hemoglobin: 15.1 g/dL — ABNORMAL HIGH (ref 12.0–15.0)
MCH: 30.9 pg (ref 26.0–34.0)
MCHC: 32.8 g/dL (ref 30.0–36.0)
MCV: 94.1 fL (ref 80.0–100.0)
Platelets: 336 10*3/uL (ref 150–400)
RBC: 4.89 MIL/uL (ref 3.87–5.11)
RDW: 12.8 % (ref 11.5–15.5)
WBC: 9.2 10*3/uL (ref 4.0–10.5)
nRBC: 0 % (ref 0.0–0.2)

## 2023-07-14 LAB — PROTIME-INR
INR: 1 (ref 0.8–1.2)
Prothrombin Time: 12.8 s (ref 11.4–15.2)

## 2023-07-14 LAB — ETHANOL: Alcohol, Ethyl (B): <10 mg/dL (ref <10)

## 2023-07-14 MED ORDER — LEVETIRACETAM 500 MG/5ML IV SOLN
2000.0000 mg | Freq: Once | INTRAVENOUS | Status: AC
  Administered 2023-07-14: 2000 mg via INTRAVENOUS
  Filled 2023-07-14: qty 20

## 2023-07-14 MED ORDER — POTASSIUM CHLORIDE CRYS ER 20 MEQ PO TBCR
40.0000 meq | EXTENDED_RELEASE_TABLET | Freq: Once | ORAL | Status: AC
  Administered 2023-07-14: 40 meq via ORAL
  Filled 2023-07-14: qty 2

## 2023-07-14 MED ORDER — IOHEXOL 350 MG/ML SOLN: 95.0000 mL | Freq: Once | INTRAVENOUS | Status: DC | PRN

## 2023-07-14 MED ORDER — FENTANYL CITRATE PF 50 MCG/ML IJ SOSY
50.0000 ug | PREFILLED_SYRINGE | Freq: Once | INTRAMUSCULAR | Status: AC
  Administered 2023-07-14: 50 ug via INTRAVENOUS
  Filled 2023-07-14: qty 1

## 2023-07-14 MED ORDER — POTASSIUM CHLORIDE 10 MEQ/100ML IV SOLN
10.0000 meq | INTRAVENOUS | Status: AC
Start: 2023-07-14 — End: 2023-07-14
  Administered 2023-07-14 (×2): 10 meq via INTRAVENOUS
  Filled 2023-07-14 (×2): qty 100

## 2023-07-14 MED ORDER — SODIUM CHLORIDE 0.9 % IV BOLUS
1000.0000 mL | Freq: Once | INTRAVENOUS | Status: AC
  Administered 2023-07-14: 1000 mL via INTRAVENOUS

## 2023-07-14 MED ORDER — ONDANSETRON HCL 4 MG/2ML IJ SOLN
4.0000 mg | Freq: Once | INTRAMUSCULAR | Status: AC
  Administered 2023-07-14: 4 mg via INTRAVENOUS
  Filled 2023-07-14: qty 2

## 2023-07-14 MED ORDER — KETOROLAC TROMETHAMINE 15 MG/ML IJ SOLN
15.0000 mg | Freq: Once | INTRAMUSCULAR | Status: AC
  Administered 2023-07-14: 15 mg via INTRAVENOUS
  Filled 2023-07-14: qty 1

## 2023-07-14 NOTE — ED Notes (Signed)
Patient transported to CT with TRN on monitor

## 2023-07-14 NOTE — Discharge Instructions (Addendum)
 Unless you are otherwise cleared by neurology, after a seizure event we recommend that you do not drive or operate any heavy machinery for at least 6 months.  You do not need to begin an antiepileptic medicine as we do think that your head injury was the reason that you had a seizure.  It is likely that you have a concussion, you may experience some headache, increased moodiness, nausea, vomiting, foggy headedness for on average around 24 to 48 hours although symptoms can linger for up to a few weeks, if you do have significant ongoing symptoms I recommend that you follow-up closely with the neurologist for further evaluation.  We want you to see the neurologist anyways to have them evaluate you after your seizure event.  Potassium was somewhat low in the emergency department, but between your oral potassium and the IV potassium that we gave you you should be up to a normal level, continue to drink electrolyte containing fluids such as Pedialyte, Gatorade to ensure that your potassium is up to a normal level and follow-up with your primary care doctor for potassium recheck in around 2 to 4 weeks.  I placed a referral to neurology and so you should receive a call from them within around 72 hours to schedule an appointment.

## 2023-07-14 NOTE — Progress Notes (Signed)
 Orthopedic Tech Progress Note Patient Details:  Mikayla Hull [facility] 07-Apr-1961 [id number]  Level 2 trauma   Patient ID: Mikayla Hull [facility], female   DOB: Dec 16, 1960, 63 y.o.   MRN: [id number]  Mikayla Hull 07/14/2023, 1:09 PM

## 2023-07-14 NOTE — ED Provider Notes (Signed)
 [place] EMERGENCY DEPARTMENT AT [facility] Provider Note   CSN: [id number] Arrival date & time: 07/14/23  1254     History  Chief Complaint  Patient presents with   Fall   Head Injury         Mikayla Hull [facility] is a 63 y.o. female with overall noncontributory past medical history who presents after a fall with possible seizure activity witnessed by passerby.  Hematoma, bleeding noted to the back of the head, she endorses some head and neck pain.  She reports a history of migraines.  She reports she does not take a migraine medication.  She denies any alcohol, drug use, tobacco use.  She is able to state her name, she is somewhat confused about place as she is amnestic to events prior to the fall.  She denies previous history of seizures.  She denies any numbness, tingling, vision changes.  HPI     Home Medications Prior to Admission medications   Medication Sig Start Date End Date Taking? Authorizing Provider  atorvastatin (LIPITOR) 10 MG tablet Take 1 tablet by mouth in the morning. 04/29/22  Yes [provider]  escitalopram (LEXAPRO) 20 MG tablet Take 20 mg by mouth in the morning. 11/06/19  Yes [provider]  naproxen sodium (ALEVE) 220 MG tablet Take 440 mg by mouth as needed.   Yes [provider]  promethazine-codeine (PHENERGAN WITH CODEINE) 6.25-10 MG/5ML syrup Take 1-2 teaspoons every 4-6 hours as needed for cough. May cause drowsiness. 06/16/17  Yes Lajean Manes, MD  traZODone (DESYREL) 50 MG tablet Take 50 mg by mouth at bedtime as needed for sleep.   Yes [provider]      Allergies    Methocarbamol    Review of Systems   Review of Systems  All other systems reviewed and are negative.   Physical Exam Updated Vital Signs BP (!) 124/53   Pulse 86   Temp 98.1 F (36.7 C)   Resp 13   Ht 5\' 6"  (1.676 m)   Wt 81.6 kg   SpO2 100%   BMI 29.05 kg/m  Physical Exam Vitals and nursing note reviewed.   Constitutional:      General: She is not in acute distress.    Appearance: Normal appearance.  HENT:     Head: Normocephalic and atraumatic.  Eyes:     General:        Right eye: No discharge.        Left eye: No discharge.  Cardiovascular:     Rate and Rhythm: Normal rate and regular rhythm.     Heart sounds: No murmur heard.    No friction rub. No gallop.  Pulmonary:     Effort: Pulmonary effort is normal.     Breath sounds: Normal breath sounds.  Abdominal:     General: Bowel sounds are normal.     Palpations: Abdomen is soft.  Skin:    General: Skin is warm and dry.     Capillary Refill: Capillary refill takes less than 2 seconds.     Comments: Large hematoma with some abrasion and bleeding noted on posterior scalp  Neurological:     Mental Status: She is alert. She is disoriented.     Comments: Alert to self, somewhat place but confused about time, situation.  Moving all 4 limbs spontaneously, intact strength 5/5 bilateral upper and lower extremities.  Right eye with 4 mm pupil, reactive to light, left eye 4 to 5  mm, and somewhat unreactive.  EOMs intact.  Psychiatric:        Mood and Affect: Mood normal.        Behavior: Behavior normal.     ED Results / Procedures / Treatments   Labs (all labs ordered are listed, but only abnormal results are displayed) Labs Reviewed  COMPREHENSIVE METABOLIC PANEL - Abnormal; Notable for the following components:      Result Value   Potassium 3.2 (*)    CO2 17 (*)    Glucose, Bld 107 (*)    Anion gap 16 (*)    All other components within normal limits  CBC - Abnormal; Notable for the following components:   Hemoglobin 15.1 (*)    All other components within normal limits  I-STAT CHEM 8, ED - Abnormal; Notable for the following components:   Potassium 3.1 (*)    Calcium, Ion 1.14 (*)    TCO2 19 (*)    Hemoglobin 15.3 (*)    All other components within normal limits  I-STAT CG4 LACTIC ACID, ED - Abnormal; Notable for the  following components:   Lactic Acid, Venous 5.3 (*)    All other components within normal limits  ETHANOL  PROTIME-INR  URINALYSIS, ROUTINE W REFLEX MICROSCOPIC  CBG MONITORING, ED  I-STAT CG4 LACTIC ACID, ED    EKG EKG Interpretation Date/Time:  Wednesday July 14 2023 13:07:06 EDT Ventricular Rate:  98 PR Interval:  140 QRS Duration:  108 QT Interval:  371 QTC Calculation: 474 R Axis:   90  Text Interpretation: Sinus rhythm Borderline right axis deviation Confirmed by Gerhard Munch [phone]) on 07/14/2023 1:11:53 PM  Radiology DG Elbow Complete Right Result Date: 07/14/2023 CLINICAL DATA:  Right elbow pain after fall. EXAM: RIGHT ELBOW - COMPLETE 3+ VIEW COMPARISON:  None Available. FINDINGS: There is no evidence of fracture, dislocation, or joint effusion. There is no evidence of arthropathy or other focal bone abnormality. Soft tissues are unremarkable. IMPRESSION: Negative. Electronically Signed   By: Obie Dredge M.D.   On: 07/14/2023 16:50   CT CERVICAL SPINE WO CONTRAST Result Date: 07/14/2023 CLINICAL DATA:  Status post fall. EXAM: CT CERVICAL SPINE WITHOUT CONTRAST TECHNIQUE: Multidetector CT imaging of the cervical spine was performed without intravenous contrast. Multiplanar CT image reconstructions were also generated. RADIATION DOSE REDUCTION: This exam was performed according to the departmental dose-optimization program which includes automated exposure control, adjustment of the mA and/or kV according to patient size and/or use of iterative reconstruction technique. COMPARISON:  None Available. FINDINGS: Alignment: Normal. Skull base and vertebrae: No acute fracture. No primary bone lesion or focal pathologic process. Soft tissues and spinal canal: No prevertebral fluid or swelling. No visible canal hematoma. Disc levels: Normal multilevel endplates are seen with normal multilevel intervertebral disc spaces. Normal, bilateral multilevel facet joints are noted. Upper  chest: Mild areas of atelectasis and/or infiltrate are seen within the bilateral apices. Other: None. IMPRESSION: 1. No acute fracture or subluxation in the cervical spine. 2. Mild areas of atelectasis and/or infiltrate within the bilateral apices. Electronically Signed   By: Aram Candela M.D.   On: 07/14/2023 14:45   CT HEAD WO CONTRAST Result Date: 07/14/2023 CLINICAL DATA:  Status post fall. EXAM: CT HEAD WITHOUT CONTRAST TECHNIQUE: Contiguous axial images were obtained from the base of the skull through the vertex without intravenous contrast. RADIATION DOSE REDUCTION: This exam was performed according to the departmental dose-optimization program which includes automated exposure control, adjustment of the mA and/or kV  according to patient size and/or use of iterative reconstruction technique. COMPARISON:  None Available. FINDINGS: Brain: No evidence of acute infarction, hemorrhage, hydrocephalus, extra-axial collection or mass lesion/mass effect. Vascular: No hyperdense vessel or unexpected calcification. Skull: Normal. Negative for fracture or focal lesion. Sinuses/Orbits: No acute finding. Other: Marked severity occipital and posterior parietal scalp soft tissue swelling is seen along the midline. An associated 4.2 cm x 1.3 cm x 4.6 cm scalp hematoma is noted. IMPRESSION: 1. Marked severity occipital and posterior parietal scalp soft tissue swelling with an associated scalp hematoma. 2. No acute intracranial abnormality. Electronically Signed   By: Aram Candela M.D.   On: 07/14/2023 14:42   DG Pelvis Portable Result Date: 07/14/2023 CLINICAL DATA:  Fall. EXAM: PORTABLE PELVIS 1-2 VIEWS COMPARISON:  None Available. FINDINGS: There is no evidence of pelvic fracture or diastasis. No pelvic bone lesions are seen. IMPRESSION: Negative. Electronically Signed   By: Lupita Raider M.D.   On: 07/14/2023 14:34   DG Chest Port 1 View Result Date: 07/14/2023 CLINICAL DATA:  Fall. EXAM: PORTABLE CHEST  1 VIEW COMPARISON:  June 11, 2021. FINDINGS: The heart size and mediastinal contours are within normal limits. Both lungs are clear. The visualized skeletal structures are unremarkable. IMPRESSION: No active disease. Electronically Signed   By: Lupita Raider M.D.   On: 07/14/2023 14:34    Procedures Procedures    Medications Ordered in ED Medications  iohexol (OMNIPAQUE) 350 MG/ML injection 95 mL (has no administration in time range)  sodium chloride 0.9 % bolus 1,000 mL (1,000 mLs Intravenous New Bag/Given 07/14/23 1353)  levETIRAcetam (KEPPRA) 2,000 mg in sodium chloride 0.9 % 250 mL IVPB (0 mg Intravenous Stopped 07/14/23 1411)  potassium chloride 10 mEq in 100 mL IVPB (0 mEq Intravenous Stopped 07/14/23 1644)  fentaNYL (SUBLIMAZE) injection 50 mcg (50 mcg Intravenous Given 07/14/23 1425)  ondansetron (ZOFRAN) injection 4 mg (4 mg Intravenous Given 07/14/23 1520)  ketorolac (TORADOL) 15 MG/ML injection 15 mg (15 mg Intravenous Given 07/14/23 1531)  potassium chloride SA (KLOR-CON M) CR tablet 40 mEq (40 mEq Oral Given 07/14/23 1703)    ED Course/ Medical Decision Making/ A&P                                 Medical Decision Making Amount and/or Complexity of Data Reviewed Labs: ordered. Radiology: ordered.   This patient is a 63 y.o. female who presents to the ED for concern of fall, possible seizure activity, this involves an extensive number of treatment options, and is a complaint that carries with it a high risk of complications and morbidity. The emergent differential diagnosis prior to evaluation includes, but is not limited to,  epidural hematoma, subdural hematoma, skull fracture, subarachnoid hemorrhage, unstable cervical spine fracture, concussion vs other MSK injury  . This is not an exhaustive differential.   Past Medical History / Co-morbidities / Social History: Diabetes, otherwise noncontributory  Physical Exam: Physical exam performed. The pertinent findings  include: Large posterior scalp hematoma, she is initially disoriented, she is alert to self but confused about time, situation, generally knows where she is, moving all 4 limbs spontaneously, question some asymmetry of pupils, or decreased reactive nests of the left pupil, seemingly resolved on repeat evaluation.  Back to neurologic baseline with no significant deficits other than patient endorsing some headache.  Vital signs stable, blood pressure 124/53 most recently.  Although somewhat elevated on arrival,  blood pressure 170/92, she was mildly tachycardic, tachypneic as well initially, pulse 103, respirations 24.  Lab Tests: I ordered, and personally interpreted labs.  The pertinent results include: CMP is notable for hypokalemia, potassium 3.2, she has a bicarb deficit, CO2 17, anion gap 16 which is likely secondary to her significant lactic acidosis, 5.3, repeat lactic acid 1.2, CBC overall unremarkable other than mild elevated hemoglobin 15.1 could suggest some degree of mild hemoconcentration.   Imaging Studies: I ordered, and independently interpreted imaging studies including CT head, C-spine, plain film radiograph of the chest, pelvis, and right elbow x-ray showed no evidence of acute intracranial abnormality, intrathoracic abnormality, fracture, dislocation, intracranial bleed, or unstable cervical spine injury.  They question some atelectasis versus infiltrate at lung apices on the CT C-spine, clinically no evidence of pneumonia, suspect atelectasis.  I agree with the radiologist interpretation  Cardiac Monitoring:  The patient was maintained on a cardiac monitor.  My attending physician Dr. Jeraldine Loots viewed and interpreted the cardiac monitored which showed an underlying rhythm of: NSR. I agree with this interpretation.   Medications: I ordered medication including potassium for hypokalemia, Toradol for headache, Zofran for nausea, fentanyl for pain, Keppra due to suspected seizure .  Reevaluation of the patient after these medicines showed that the patient improved. I have reviewed the patients home medicines and have made adjustments as needed.   Disposition: After consideration of the diagnostic results and the patients response to treatment, I feel that patient back to neurologic baseline, no need for laceration repair, posterior scalp with large hematoma but no other significant injury.Marland Kitchen   emergency department workup does not suggest an emergent condition requiring admission or immediate intervention beyond what has been performed at this time. The plan is: as above, close neuro follow up, seizure precautions, concussion precautions. The patient is safe for discharge and has been instructed to return immediately for worsening symptoms, change in symptoms or any other concerns.  I discussed this case with my attending physician Dr. Jeraldine Loots who cosigned this note including patient's presenting symptoms, physical exam, and planned diagnostics and interventions. Attending physician stated agreement with plan or made changes to plan which were implemented.    Final Clinical Impression(s) / ED Diagnoses Final diagnoses:  Injury of head, initial encounter  Seizure (HCC)  Hypokalemia    Rx / DC Orders ED Discharge Orders          Ordered    Ambulatory referral to Neurology       Comments: An appointment is requested in approximately: 2 weeks   07/14/23 1639              [place] 07/14/23 1722    Gerhard Munch, MD 07/15/23 [phone]

## 2023-07-14 NOTE — ED Notes (Signed)
 Patient transported to X-ray

## 2023-07-14 NOTE — ED Notes (Signed)
 Trauma Response Nurse Documentation   Mikayla Hull is a 63 y.o. female arriving to [facility] ED via  [facility]  Trauma was activated as a Level 2 by [employer] based on the following trauma criteria GCS 10-14 associated with trauma or AVPU < A.  Patient cleared for CT by Dr. Jeraldine Loots. Pt transported to CT with trauma response nurse present to monitor. RN remained with the patient throughout their absence from the department for clinical observation.   GCS 14.    History   History reviewed. No pertinent past medical history.   History reviewed. No pertinent surgical history.     Initial Focused Assessment (If applicable, or please see trauma documentation): Airway - CLear Breathing - unlabored Circulation - strong peripheral pulses GCS --confused to place, date, event on arrival  CT's Completed:   CT Head and CT C-Spine   Interventions:  Labs Xrays CT scans  Plan for disposition:  Discharge home    Event Summary: To ED via [facility] after falling backward while in the grocery store- has a hematoma to the back of her head, no other signs of injury.  Confused to event with memory returning slowly while getting treated in the ED.  Pupils were 5cm and unreactive bilaterally- denies using any illicit drugs, gummies, or going to the eye doctor recently.  Does state that she has a hx of migraines.    Lesle Chris Joson Sapp  Trauma Response RN  Please call TRN at [phone] for further assistance.

## 2023-07-14 NOTE — ED Triage Notes (Signed)
 Patient bib [facility] from [employer] after an unwitnessed fall. Patient was found to be possibly having seizure like activity. EMS reports patient only alert to self and asking repeated questions about what happened. Hematoma to the back of the head.  EMS reports unequal pupils. Left pupil 5 and unreactive right eye 4 and reactive.
